# Patient Record
Sex: Male | Born: 1955 | Race: Black or African American | Hispanic: No | Marital: Married | Smoking: Never smoker
Health system: Southern US, Community
[De-identification: ages and names within clinical notes are randomized; demographics above are authoritative.]

## PROBLEM LIST (undated history)

## (undated) DIAGNOSIS — J45909 Unspecified asthma, uncomplicated: Secondary | ICD-10-CM

## (undated) HISTORY — PX: CYST REMOVAL NECK: SHX6281

## (undated) HISTORY — PX: URINARY SURGERY: SHX2626

---

## 2014-11-01 ENCOUNTER — Encounter (HOSPITAL_COMMUNITY): Payer: Self-pay

## 2014-11-01 ENCOUNTER — Emergency Department (HOSPITAL_COMMUNITY)
Admission: EM | Admit: 2014-11-01 | Discharge: 2014-11-01 | Disposition: A | Discharge status: Home / Self Care | Payer: Non-veteran care | Attending: Emergency Medicine | Admitting: Emergency Medicine

## 2014-11-01 DIAGNOSIS — M545 Low back pain, unspecified: Secondary | ICD-10-CM

## 2014-11-01 MED ORDER — ORPHENADRINE CITRATE ER 100 MG PO TB12: 100.0000 mg | ORAL_TABLET | Freq: Two times a day (BID) | ORAL | Status: DC

## 2014-11-01 MED ORDER — OXYCODONE-ACETAMINOPHEN 5-325 MG PO TABS
1.0000 | ORAL_TABLET | ORAL | Status: DC | PRN
Start: 2014-11-01 — End: 2021-03-03

## 2014-11-01 NOTE — ED Notes (Signed)
Pt states back pain started yesterday, got worse overnight, today lower back pain is unbearable. Pt denies trauma and heavy lifting.

## 2014-11-01 NOTE — Discharge Instructions (Signed)
Take your ibuprofen 800 mg tablets three times a day.  Back Pain, Adult Low back pain is very common. About 1 in 5 people have back pain.The cause of low back pain is rarely dangerous. The pain often gets better over time.About half of people with a sudden onset of back pain feel better in just 2 weeks. About 8 in 10 people feel better by 6 weeks.  CAUSES Some common causes of back pain include:  Strain of the muscles or ligaments supporting the spine.  Wear and tear (degeneration) of the spinal discs.  Arthritis.  Direct injury to the back. DIAGNOSIS Most of the time, the direct cause of low back pain is not known.However, back pain can be treated effectively even when the exact cause of the pain is unknown.Answering your caregiver's questions about your overall health and symptoms is one of the most accurate ways to make sure the cause of your pain is not dangerous. If your caregiver needs more information, he or she may order lab work or imaging tests (X-rays or MRIs).However, even if imaging tests show changes in your back, this usually does not require surgery. HOME CARE INSTRUCTIONS For many people, back pain returns.Since low back pain is rarely dangerous, it is often a condition that people can learn to [facility] their own.   Remain active. It is stressful on the back to sit or stand in one place. Do not sit, drive, or stand in one place for more than 30 minutes at a time. Take short walks on level surfaces as soon as pain allows.Try to increase the length of time you walk each day.  Do not stay in bed.Resting more than 1 or 2 days can delay your recovery.  Do not avoid exercise or work.Your body is made to move.It is not dangerous to be active, even though your back may hurt.Your back will likely heal faster if you return to being active before your pain is gone.  Pay attention to your body when you bend and lift. Many people have less discomfortwhen lifting if they  bend their knees, keep the load close to their bodies,and avoid twisting. Often, the most comfortable positions are those that put less stress on your recovering back.  Find a comfortable position to sleep. Use a firm mattress and lie on your side with your knees slightly bent. If you lie on your back, put a pillow under your knees.  Only take over-the-counter or prescription medicines as directed by your caregiver. Over-the-counter medicines to reduce pain and inflammation are often the most helpful.Your caregiver may prescribe muscle relaxant drugs.These medicines help dull your pain so you can more quickly return to your normal activities and healthy exercise.  Put ice on the injured area.  Put ice in a plastic bag.  Place a towel between your skin and the bag.  Leave the ice on for 15-20 minutes, 03-04 times a day for the first 2 to 3 days. After that, ice and heat may be alternated to reduce pain and spasms.  Ask your caregiver about trying back exercises and gentle massage. This may be of some benefit.  Avoid feeling anxious or stressed.Stress increases muscle tension and can worsen back pain.It is important to recognize when you are anxious or stressed and learn ways to manage it.Exercise is a great option. SEEK MEDICAL CARE IF:  You have pain that is not relieved with rest or medicine.  You have pain that does not improve in 1 week.  You  have new symptoms.  You are generally not feeling well. SEEK IMMEDIATE MEDICAL CARE IF:   You have pain that radiates from your back into your legs.  You develop new bowel or bladder control problems.  You have unusual weakness or numbness in your arms or legs.  You develop nausea or vomiting.  You develop abdominal pain.  You feel faint. Document Released: 02/09/2005 Document Revised: 08/11/2011 Document Reviewed: 06/13/2013 [facility] Patient Information 2015 [place], [place]. This information is not intended to replace advice  given to you by your health care provider. Make sure you discuss any questions you have with your health care provider.  Orphenadrine tablets What is this medicine? ORPHENADRINE (or FEN a dreen) helps to relieve pain and stiffness in muscles and can treat muscle spasms. This medicine may be used for other purposes; ask your health care provider or pharmacist if you have questions. COMMON BRAND NAME(S): Norflex What should I tell my health care provider before I take this medicine? They need to know if you have any of these conditions: -glaucoma -heart disease -kidney disease -myasthenia gravis -peptic ulcer disease -prostate disease -stomach problems -an unusual or allergic reaction to orphenadrine, other medicines, foods, lactose, dyes, or preservatives -pregnant or trying to get pregnant -breast-feeding How should I use this medicine? Take this medicine by mouth with a full glass of water. Follow the directions on the prescription label. Take your medicine at regular intervals. Do not take your medicine more often than directed. Do not take more than you are told to take. Talk to your pediatrician regarding the use of this medicine in children. Special care may be needed. Patients over 58 years old may have a stronger reaction and need a smaller dose. Overdosage: If you think you have taken too much of this medicine contact a poison control center or emergency room at once. NOTE: This medicine is only for you. Do not share this medicine with others. What if I miss a dose? If you miss a dose, take it as soon as you can. If it is almost time for your next dose, take only that dose. Do not take double or extra doses. What may interact with this medicine? -alcohol -antihistamines -barbiturates, like phenobarbital -benzodiazepines -cyclobenzaprine -medicines for pain -phenothiazines like chlorpromazine, mesoridazine, prochlorperazine, thioridazine This list may not describe all  possible interactions. Give your health care provider a list of all the medicines, herbs, non-prescription drugs, or dietary supplements you use. Also tell them if you smoke, drink alcohol, or use illegal drugs. Some items may interact with your medicine. What should I watch for while using this medicine? Your mouth may get dry. Chewing sugarless gum or sucking hard candy, and drinking plenty of water may help. Contact your doctor if the problem does not go away or is severe. This medicine may cause dry eyes and blurred vision. If you wear contact lenses you may feel some discomfort. Lubricating drops may help. See your eye doctor if the problem does not go away or is severe. You may get drowsy or dizzy. Do not drive, use machinery, or do anything that needs mental alertness until you know how this medicine affects you. Do not stand or sit up quickly, especially if you are an older patient. This reduces the risk of dizzy or fainting spells. Alcohol may interfere with the effect of this medicine. Avoid alcoholic drinks. What side effects may I notice from receiving this medicine? Side effects that you should report to your doctor or health  care professional as soon as possible: -allergic reactions like skin rash, itching or hives, swelling of the face, lips, or tongue -changes in vision -difficulty breathing -fast heartbeat or palpitations -hallucinations -light headedness, fainting spells -vomiting Side effects that usually do not require medical attention (report to your doctor or health care professional if they continue or are bothersome): -dizziness -drowsiness -headache -nausea This list may not describe all possible side effects. Call your doctor for medical advice about side effects. You may report side effects to FDA at 1-800-FDA-1088. Where should I keep my medicine? Keep out of the reach of children. Store at room temperature between 15 and 30 degrees C (59 and 86 degrees F). Protect  from light. Keep container tightly closed. Throw away any unused medicine after the expiration date. NOTE: This sheet is a summary. It may not cover all possible information. If you have questions about this medicine, talk to your doctor, pharmacist, or health care provider.  2015, Elsevier/Gold Standard. (2007-09-06 17:19:12)  Acetaminophen; Oxycodone tablets What is this medicine? ACETAMINOPHEN; OXYCODONE (a set a MEE noe fen; ox i KOE done) is a pain reliever. It is used to treat mild to moderate pain. This medicine may be used for other purposes; ask your health care provider or pharmacist if you have questions. COMMON BRAND NAME(S): Endocet, Magnacet, Narvox, Percocet, Perloxx, Primalev, Primlev, Roxicet, Xolox What should I tell my health care provider before I take this medicine? They need to know if you have any of these conditions: -brain tumor -Crohn's disease, inflammatory bowel disease, or ulcerative colitis -drug abuse or addiction -head injury -heart or circulation problems -if you often drink alcohol -kidney disease or problems going to the bathroom -liver disease -lung disease, asthma, or breathing problems -an unusual or allergic reaction to acetaminophen, oxycodone, other opioid analgesics, other medicines, foods, dyes, or preservatives -pregnant or trying to get pregnant -breast-feeding How should I use this medicine? Take this medicine by mouth with a full glass of water. Follow the directions on the prescription label. Take your medicine at regular intervals. Do not take your medicine more often than directed. Talk to your pediatrician regarding the use of this medicine in children. Special care may be needed. Patients over 40 years old may have a stronger reaction and need a smaller dose. Overdosage: If you think you have taken too much of this medicine contact a poison control center or emergency room at once. NOTE: This medicine is only for you. Do not share this  medicine with others. What if I miss a dose? If you miss a dose, take it as soon as you can. If it is almost time for your next dose, take only that dose. Do not take double or extra doses. What may interact with this medicine? -alcohol -antihistamines -barbiturates like amobarbital, butalbital, butabarbital, methohexital, pentobarbital, phenobarbital, thiopental, and secobarbital -benztropine -drugs for bladder problems like solifenacin, trospium, oxybutynin, tolterodine, hyoscyamine, and methscopolamine -drugs for breathing problems like ipratropium and tiotropium -drugs for certain stomach or intestine problems like propantheline, homatropine methylbromide, glycopyrrolate, atropine, belladonna, and dicyclomine -general anesthetics like etomidate, ketamine, nitrous oxide, propofol, desflurane, enflurane, halothane, isoflurane, and sevoflurane -medicines for depression, anxiety, or psychotic disturbances -medicines for sleep -muscle relaxants -naltrexone -narcotic medicines (opiates) for pain -phenothiazines like perphenazine, thioridazine, chlorpromazine, mesoridazine, fluphenazine, prochlorperazine, promazine, and trifluoperazine -scopolamine -tramadol -trihexyphenidyl This list may not describe all possible interactions. Give your health care provider a list of all the medicines, herbs, non-prescription drugs, or dietary supplements you use. Also tell  them if you smoke, drink alcohol, or use illegal drugs. Some items may interact with your medicine. What should I watch for while using this medicine? Tell your doctor or health care professional if your pain does not go away, if it gets worse, or if you have new or a different type of pain. You may develop tolerance to the medicine. Tolerance means that you will need a higher dose of the medication for pain relief. Tolerance is normal and is expected if you take this medicine for a long time. Do not suddenly stop taking your medicine  because you may develop a severe reaction. Your body becomes used to the medicine. This does NOT mean you are addicted. Addiction is a behavior related to getting and using a drug for a non-medical reason. If you have pain, you have a medical reason to take pain medicine. Your doctor will tell you how much medicine to take. If your doctor wants you to stop the medicine, the dose will be slowly lowered over time to avoid any side effects. You may get drowsy or dizzy. Do not drive, use machinery, or do anything that needs mental alertness until you know how this medicine affects you. Do not stand or sit up quickly, especially if you are an older patient. This reduces the risk of dizzy or fainting spells. Alcohol may interfere with the effect of this medicine. Avoid alcoholic drinks. There are different types of narcotic medicines (opiates) for pain. If you take more than one type at the same time, you may have more side effects. Give your health care provider a list of all medicines you use. Your doctor will tell you how much medicine to take. Do not take more medicine than directed. Call emergency for help if you have problems breathing. The medicine will cause constipation. Try to have a bowel movement at least every 2 to 3 days. If you do not have a bowel movement for 3 days, call your doctor or health care professional. Do not take Tylenol (acetaminophen) or medicines that have acetaminophen with this medicine. Too much acetaminophen can be very dangerous. Many nonprescription medicines contain acetaminophen. Always read the labels carefully to avoid taking more acetaminophen. What side effects may I notice from receiving this medicine? Side effects that you should report to your doctor or health care professional as soon as possible: -allergic reactions like skin rash, itching or hives, swelling of the face, lips, or tongue -breathing difficulties, wheezing -confusion -light headedness or fainting  spells -severe stomach pain -unusually weak or tired -yellowing of the skin or the whites of the eyes Side effects that usually do not require medical attention (report to your doctor or health care professional if they continue or are bothersome): -dizziness -drowsiness -nausea -vomiting This list may not describe all possible side effects. Call your doctor for medical advice about side effects. You may report side effects to FDA at 1-800-FDA-1088. Where should I keep my medicine? Keep out of the reach of children. This medicine can be abused. Keep your medicine in a safe place to protect it from theft. Do not share this medicine with anyone. Selling or giving away this medicine is dangerous and against the law. Store at room temperature between 20 and 25 degrees C (68 and 77 degrees F). Keep container tightly closed. Protect from light. This medicine may cause accidental overdose and death if it is taken by other adults, children, or pets. Flush any unused medicine down the toilet to reduce the  chance of harm. Do not use the medicine after the expiration date. NOTE: This sheet is a summary. It may not cover all possible information. If you have questions about this medicine, talk to your doctor, pharmacist, or health care provider.  2015, Elsevier/Gold Standard. (2012-10-03 13:17:35)

## 2014-11-01 NOTE — ED Provider Notes (Signed)
CSN: [id number]     Arrival date & time 11/01/14  4098 History   First MD Initiated Contact with Patient 11/01/14 [phone]     Chief Complaint  Patient presents with  . Back Pain     (Consider location/radiation/quality/duration/timing/severity/associated sxs/prior Treatment) Patient is a 59 y.o. male presenting with back pain. The history is provided by the patient.  Back Pain He started having pain in his lower back yesterday and it got worse today. Pain is sharp and he rates it at 10/10. It is worse with movement and better when he stays in one position. There is no radiation of pain. There is no numbness or weakness and he denies bowel or bladder dysfunction. Denies any recent trauma or unusual lifting or bending. He has had back problems in the past. He is a nonsmoker and does not have a history of hypertension.  History reviewed. No pertinent past medical history. Past Surgical History  Procedure Laterality Date  . Cyst removal neck    . Urinary surgery     No family history on file. Social History  Substance Use Topics  . Smoking status: Never Smoker   . Smokeless tobacco: None  . Alcohol Use: No    Review of Systems  Musculoskeletal: Positive for back pain.  All other systems reviewed and are negative.     Allergies  Review of patient's allergies indicates no known allergies.  Home Medications   Prior to Admission medications   Not on File   BP 129/70 mmHg  Pulse 73  Temp(Src) 98.3 F (36.8 C) (Oral)  Resp 14  Ht  (1.651 m)  Wt 185 lb (83.915 kg)  BMI 30.79 kg/m2  SpO2 97% Physical Exam  Nursing note and vitals reviewed.  59 year old male, resting comfortably and in no acute distress. Vital signs are normal. Oxygen saturation is 97%, which is normal. Head is normocephalic and atraumatic. PERRLA, EOMI. Oropharynx is clear. Neck is nontender and supple without adenopathy or JVD. Back is moderately tender in the left paralumbar area with mild to moderate  spasm. There is no midline tenderness. Straight leg raise is negative on the right and positive on the left at 45. Lungs are clear without rales, wheezes, or rhonchi. Chest is nontender. Heart has regular rate and rhythm without murmur. Abdomen is soft, flat, nontender without masses or hepatosplenomegaly and peristalsis is normoactive. Aorta is not palpable and no bruits are heard. Extremities have no cyanosis or edema, full range of motion is present. Skin is warm and dry without rash. Neurologic: Mental status is normal, cranial nerves are intact, there are no motor or sensory deficits.   ED Course  Procedures (including critical care time)  MDM   Final diagnoses:  Midline low back pain without sciatica    Musculoskeletal low back pain. Prescriptions are given for orphenadrine and oxycodone-acetaminophen. Patient was offered prescription for naproxen but states that he has ibuprofen 800 mg tablets at home and that they work well for him. He is advised to take those 3 times a day.    Dione Booze, MD 11/01/14 [phone]

## 2019-05-18 ENCOUNTER — Ambulatory Visit: Payer: Non-veteran care | Attending: Family

## 2019-05-18 DIAGNOSIS — Z23 Encounter for immunization: Secondary | ICD-10-CM

## 2019-05-18 NOTE — Progress Notes (Signed)
   Covid-19 Vaccination Clinic  Name:  Andre Fitzpatrick    MRN: [id number] DOB: [birth year]  05/18/2019  Mr. Read was observed post Covid-19 immunization for 15 minutes without incident. He was provided with Vaccine Information Sheet and instruction to access the V-Safe system.   [name] was instructed to call 911 with any severe reactions post vaccine: Marland Kitchen Difficulty breathing  . Swelling of face and throat  . A fast heartbeat  . A bad rash all over body  . Dizziness and weakness   Immunizations Administered    Name Date Dose VIS Date Route   Moderna COVID-19 Vaccine 05/18/2019 12:52 PM 0.5 mL 01/24/2019 Intramuscular   Manufacturer: Moderna   Lot: [id number]   NDC: 33825-053-97

## 2019-06-20 ENCOUNTER — Ambulatory Visit: Payer: Non-veteran care

## 2019-06-20 ENCOUNTER — Ambulatory Visit: Payer: Non-veteran care | Attending: Family

## 2019-06-20 DIAGNOSIS — Z23 Encounter for immunization: Secondary | ICD-10-CM

## 2019-06-20 NOTE — Progress Notes (Signed)
   Covid-19 Vaccination Clinic  Name:  Andre Fitzpatrick    MRN: [id number] DOB: 1955-09-09  06/20/2019  [name] was observed post Covid-19 immunization for 15 minutes without incident. He was provided with Vaccine Information Sheet and instruction to access the V-Safe system.   [name] was instructed to call 911 with any severe reactions post vaccine: Marland Kitchen Difficulty breathing  . Swelling of face and throat  . A fast heartbeat  . A bad rash all over body  . Dizziness and weakness   Immunizations Administered    Name Date Dose VIS Date Route   Moderna COVID-19 Vaccine 06/20/2019  3:04 PM 0.5 mL 01/2019 Intramuscular   Manufacturer: Moderna   Lot: [id number]   NDC: 06582-608-88

## 2020-02-08 ENCOUNTER — Ambulatory Visit: Payer: Non-veteran care | Attending: Family

## 2020-02-08 DIAGNOSIS — Z23 Encounter for immunization: Secondary | ICD-10-CM

## 2020-02-12 ENCOUNTER — Ambulatory Visit: Payer: Non-veteran care

## 2020-06-12 NOTE — Progress Notes (Signed)
   Covid-19 Vaccination Clinic  Name:  ALICE VITELLI    MRN: [id number] DOB: Jul 04, 1955  06/12/2020  Mr. Ocallaghan was observed post Covid-19 immunization for 15 minutes without incident. He was provided with Vaccine Information Sheet and instruction to access the V-Safe system.   Mr. Ayala was instructed to call 911 with any severe reactions post vaccine: Marland Kitchen Difficulty breathing  . Swelling of face and throat  . A fast heartbeat  . A bad rash all over body  . Dizziness and weakness   Immunizations Administered    Name Date Dose VIS Date Route   Moderna Covid-19 Booster Vaccine 02/08/2020 11:00 AM 0.25 mL 12/13/2019 Intramuscular   Manufacturer: Moderna   Lot: [id number]   NDC: 91791-505-69

## 2020-09-12 ENCOUNTER — Ambulatory Visit: Payer: Non-veteran care | Attending: Family

## 2020-09-12 DIAGNOSIS — Z23 Encounter for immunization: Secondary | ICD-10-CM

## 2020-09-12 NOTE — Progress Notes (Signed)
   Covid-19 Vaccination Clinic  Name:  NICCOLAS LOEPER    MRN: [id number] DOB: [birth year]  09/12/2020  Mr. Kleckner was observed post Covid-19 immunization for 15 minutes without incident. He was provided with Vaccine Information Sheet and instruction to access the V-Safe system.   Mr. Tindel was instructed to call 911 with any severe reactions post vaccine: Difficulty breathing  Swelling of face and throat  A fast heartbeat  A bad rash all over body  Dizziness and weakness   Immunizations Administered     Name Date Dose VIS Date Route   Moderna Covid-19 Booster Vaccine 09/12/2020  1:45 PM 0.25 mL 12/13/2019 Intramuscular   Manufacturer: Moderna   Lot: [id number]   NDC: 92446-286-38

## 2021-03-03 ENCOUNTER — Ambulatory Visit (HOSPITAL_COMMUNITY)
Admission: EM | Admit: 2021-03-03 | Discharge: 2021-03-03 | Disposition: A | Discharge status: Home / Self Care | Payer: No Typology Code available for payment source | Attending: Emergency Medicine | Admitting: Emergency Medicine

## 2021-03-03 ENCOUNTER — Encounter (HOSPITAL_COMMUNITY): Payer: Self-pay

## 2021-03-03 ENCOUNTER — Ambulatory Visit (INDEPENDENT_AMBULATORY_CARE_PROVIDER_SITE_OTHER): Payer: No Typology Code available for payment source

## 2021-03-03 ENCOUNTER — Other Ambulatory Visit: Payer: Self-pay

## 2021-03-03 DIAGNOSIS — Z20822 Contact with and (suspected) exposure to covid-19: Secondary | ICD-10-CM | POA: Insufficient documentation

## 2021-03-03 DIAGNOSIS — J189 Pneumonia, unspecified organism: Secondary | ICD-10-CM

## 2021-03-03 DIAGNOSIS — R030 Elevated blood-pressure reading, without diagnosis of hypertension: Secondary | ICD-10-CM | POA: Insufficient documentation

## 2021-03-03 LAB — POC INFLUENZA A AND B ANTIGEN (URGENT CARE ONLY)
INFLUENZA A ANTIGEN, POC: NEGATIVE
INFLUENZA B ANTIGEN, POC: NEGATIVE

## 2021-03-03 MED ORDER — AMOXICILLIN-POT CLAVULANATE 875-125 MG PO TABS: 1.0000 | ORAL_TABLET | Freq: Two times a day (BID) | ORAL | 0 refills | Status: AC

## 2021-03-03 MED ORDER — PROMETHAZINE-DM 6.25-15 MG/5ML PO SYRP: 5.0000 mL | ORAL_SOLUTION | Freq: Four times a day (QID) | ORAL | 0 refills | Status: DC | PRN

## 2021-03-03 MED ORDER — AEROCHAMBER PLUS FLO-VU LARGE MISC: 1.0000 | Freq: Once | 0 refills | Status: AC

## 2021-03-03 MED ORDER — GUAIFENESIN 400 MG PO TABS: ORAL_TABLET | ORAL | 0 refills | Status: DC

## 2021-03-03 MED ORDER — ALBUTEROL SULFATE HFA 108 (90 BASE) MCG/ACT IN AERS: 2.0000 | INHALATION_SPRAY | Freq: Four times a day (QID) | RESPIRATORY_TRACT | 0 refills | Status: DC | PRN

## 2021-03-03 MED ORDER — ALBUTEROL SULFATE (2.5 MG/3ML) 0.083% IN NEBU
INHALATION_SOLUTION | RESPIRATORY_TRACT | Status: AC
  Filled 2021-03-03: qty 3

## 2021-03-03 MED ORDER — ALBUTEROL SULFATE (2.5 MG/3ML) 0.083% IN NEBU
2.5000 mg | INHALATION_SOLUTION | Freq: Once | RESPIRATORY_TRACT | Status: AC
  Administered 2021-03-03: 2.5 mg via RESPIRATORY_TRACT

## 2021-03-03 NOTE — Discharge Instructions (Addendum)
Your chest x-ray today was concerning for right-sided pneumonia.  For this reason, I recommend that you begin taking Augmentin for the next 7 days.  Please take 1 tablet in the morning and 1 tablet in the evening.  Please do not skip doses and please take all as prescribed.  I also recommend that you continue using Mucinex and Tylenol as you have been taking.  In addition to this, please inhale albuterol, 2 puffs up to 4 times daily as needed.  When you pick up this prescription, there will be a spacer along with it.  Please asked the pharmacist to show you how to use this.  I recommend that for the next 2 to 3 days, please schedule these doses morning, midday, afternoon and bedtime.  For nighttime cough, please take Promethazine DM 5 mL 30 to 45 minutes prior to bedtime.  It is very important that you follow-up with your primary care provider in the next 3 to 5 days for repeat evaluation to ensure that you continue to improve.  Certainly, if your symptoms get worse despite these medication recommendations, you should go to the emergency room for further evaluation.  Thank you for visiting urgent care today.  I hope you feel better soon.

## 2021-03-03 NOTE — ED Triage Notes (Signed)
Pt c/o productive cough with yellow/white sputum and SOB x2 days. C/o wheezing today.

## 2021-03-03 NOTE — ED Provider Notes (Signed)
[facility]    CSN: [id number] Arrival date & time: 03/03/21  1222    HISTORY   Chief Complaint  Patient presents with   Shortness of Breath   Cough   HPI Andre Fitzpatrick is a 66 y.o. male. Patient complains of a 2-day history of cough productive of yellow/white sputum and shortness of breath.  Patient states he feels like he is wheezing today.  On arrival, patient has a significantly elevated blood pressure, temp of 99.8 and a pulse of 114.  Patient is here with his wife today who states that he became sick very quickly.  She states she has been giving him Tylenol and Mucinex with some relief of symptoms.  On physical exam, patient is much warmer than oral temperature of 98.8.  Patient denies history of asthma, allergies, COPD, previous prescriptions for inhaled respiratory medications.  Today is negative.  The history is provided by the patient.  History reviewed. No pertinent past medical history. There are no problems to display for this patient.  Past Surgical History:  Procedure Laterality Date   CYST REMOVAL NECK     URINARY SURGERY      Home Medications    Prior to Admission medications   Not on File   Family History History reviewed. No pertinent family history. Social History Social History   Tobacco Use   Smoking status: Never   Smokeless tobacco: Never  Substance Use Topics   Alcohol use: No   Allergies   Patient has no known allergies.  Review of Systems Review of Systems Pertinent findings noted in history of present illness.   Physical Exam Triage Vital Signs ED Triage Vitals  Enc Vitals Group     BP 12/20/20 0827 (!) 147/82     Pulse Rate 12/20/20 0827 72     Resp 12/20/20 0827 18     Temp 12/20/20 0827 98.3 F (36.8 C)     Temp Source 12/20/20 0827 Oral     SpO2 12/20/20 0827 98 %     Weight --      Height --      Head Circumference --      Peak Flow --      Pain Score 12/20/20 0826 5     Pain Loc --      Pain Edu? --       Excl. in GC? --   No data found.  Updated Vital Signs BP (!) 190/94 (BP Location: Left Arm)    Pulse (!) 114    Temp 99.8 F (37.7 C) (Oral)    Resp 20    SpO2 95%   Physical Exam Constitutional:      Appearance: He is ill-appearing.  HENT:     Head: Normocephalic and atraumatic.     Salivary Glands: Right salivary gland is not diffusely enlarged or tender. Left salivary gland is not diffusely enlarged or tender.     Right Ear: Tympanic membrane, ear canal and external ear normal.     Left Ear: Tympanic membrane, ear canal and external ear normal.     Nose: Congestion and rhinorrhea present. Rhinorrhea is clear.     Right Sinus: No maxillary sinus tenderness or frontal sinus tenderness.     Left Sinus: No maxillary sinus tenderness.     Mouth/Throat:     Mouth: Mucous membranes are moist.     Pharynx: Pharyngeal swelling, posterior oropharyngeal erythema and uvula swelling present.     Tonsils: No tonsillar exudate.  0 on the right. 0 on the left.  Cardiovascular:     Rate and Rhythm: Normal rate and regular rhythm.     Pulses: Normal pulses.  Pulmonary:     Effort: Pulmonary effort is normal. No accessory muscle usage, prolonged expiration or respiratory distress.     Breath sounds: No stridor. Examination of the right-upper field reveals wheezing. Examination of the left-upper field reveals wheezing. Examination of the right-middle field reveals wheezing and rales. Examination of the left-middle field reveals wheezing and rales. Examination of the right-lower field reveals wheezing. Examination of the left-lower field reveals wheezing. Wheezing and rales present. No rhonchi.     Comments: Turbulent breath sounds throughout   Abdominal:     General: Abdomen is flat. Bowel sounds are normal.     Palpations: Abdomen is soft.  Musculoskeletal:        General: Normal range of motion.  Lymphadenopathy:     Cervical: Cervical adenopathy present.     Right cervical: Superficial  cervical adenopathy, deep cervical adenopathy and posterior cervical adenopathy present.     Left cervical: Superficial cervical adenopathy, deep cervical adenopathy and posterior cervical adenopathy present.  Skin:    General: Skin is warm and dry.  Neurological:     General: No focal deficit present.     Mental Status: He is alert and oriented to person, place, and time.     Motor: Motor function is intact.     Coordination: Coordination is intact.     Gait: Gait is intact.     Deep Tendon Reflexes: Reflexes are normal and symmetric.  Psychiatric:        Attention and Perception: Attention and perception normal.        Mood and Affect: Mood and affect normal.        Speech: Speech normal.        Behavior: Behavior normal. Behavior is cooperative.        Thought Content: Thought content normal.    Visual Acuity Right Eye Distance:   Left Eye Distance:   Bilateral Distance:    Right Eye Near:   Left Eye Near:    Bilateral Near:     UC Couse / Diagnostics / Procedures:    EKG  Radiology DG Chest 2 View  Result Date: 03/03/2021 CLINICAL DATA:  Decreased breath sounds cough EXAM: CHEST - 2 VIEW COMPARISON:  None. FINDINGS: The heart size and mediastinal contours are within normal limits. Hazy right perihilar airspace opacity. Left lung appears clear. No pleural effusion or pneumothorax. The visualized skeletal structures are unremarkable. IMPRESSION: Hazy right perihilar airspace opacity suspicious for pneumonia. Electronically Signed   By: Duanne GuessNicholas  Plundo D.O.   On: 03/03/2021 14:20    Procedures Procedures (including critical care time)  UC Diagnoses / Final Clinical Impressions(s)   I have reviewed the triage vital signs and the nursing notes.  Pertinent labs & imaging results that were available during my care of the patient were reviewed by me and considered in my medical decision making (see chart for details).   Final diagnoses:  Community acquired pneumonia of right  lung, unspecified part of lung   Right-sided pneumonia.  Patient had significant improvement of breath sounds with albuterol treatment.  Began Augmentin, continue albuterol.  Continue Mucinex and Tylenol.  Promethazine DM added for nighttime cough.  Patient advised to follow-up in 3 to 5 days with his primary care provider.  ED Prescriptions     Medication Sig Dispense Auth. Provider  amoxicillin-clavulanate (AUGMENTIN) 875-125 MG tablet Take 1 tablet by mouth 2 (two) times daily for 7 days. 14 tablet Theadora RamaMorgan, Fraizer Scales, PA-C   albuterol (VENTOLIN HFA) 108 (90 Base) MCG/ACT inhaler Inhale 2 puffs into the lungs every 6 (six) hours as needed for wheezing or shortness of breath (Cough). 18 g Theadora RamaMorgan, Canal Scales, PA-C   guaifenesin (HUMIBID E) 400 MG TABS tablet Take 1 tablet 3 times daily as needed for chest congestion and cough 21 tablet Theadora RamaMorgan, Kerrick Scales, PA-C   promethazine-dextromethorphan (PROMETHAZINE-DM) 6.25-15 MG/5ML syrup Take 5 mLs by mouth 4 (four) times daily as needed for cough. 180 mL Theadora RamaMorgan, Fleece Scales, PA-C   Spacer/Aero-Holding Chambers (AEROCHAMBER PLUS FLO-VU LARGE) MISC 1 each by Other route once for 1 dose. 1 each Theadora RamaMorgan, Kimmons Scales, PA-C      PDMP not reviewed this encounter.  Pending results:  Labs Reviewed  SARS CORONAVIRUS 2 (TAT 6-24 HRS)  POC INFLUENZA A AND B ANTIGEN (URGENT CARE ONLY)    Medications Ordered in UC: Medications  albuterol (PROVENTIL) (2.5 MG/3ML) 0.083% nebulizer solution 2.5 mg (2.5 mg Nebulization Given 03/03/21 1358)    Disposition Upon Discharge:  Condition: stable for discharge home Home: take medications as prescribed; routine discharge instructions as discussed; follow up as advised.  Patient presented with an acute illness with associated systemic symptoms and significant discomfort requiring urgent management. In my opinion, this is a condition that a prudent lay person (someone who possesses an average knowledge  of health and medicine) may potentially expect to result in complications if not addressed urgently such as respiratory distress, impairment of bodily function or dysfunction of bodily organs.   Routine symptom specific, illness specific and/or disease specific instructions were discussed with the patient and/or caregiver at length.   As such, the patient has been evaluated and assessed, work-up was performed and treatment was provided in alignment with urgent care protocols and evidence based medicine.  Patient/parent/caregiver has been advised that the patient may require follow up for further testing and treatment if the symptoms continue in spite of treatment, as clinically indicated and appropriate.  If the patient was tested for COVID-19, Influenza and/or RSV, then the patient/parent/guardian was advised to isolate at home pending the results of his/her diagnostic coronavirus test and potentially longer if theyre positive. I have also advised pt that if his/her COVID-19 test returns positive, it's recommended to self-isolate for at least 10 days after symptoms first appeared AND until fever-free for 24 hours without fever reducer AND other symptoms have improved or resolved. Discussed self-isolation recommendations as well as instructions for household member/close contacts as per the [facility] and [place] DHHS, and also gave patient the COVID packet with this information.  Patient/parent/caregiver has been [facility]-5 days if no better; to PCP or the Emergency Department if new signs and symptoms develop, or if the current signs or symptoms continue to change or worsen for further workup, evaluation and treatment as clinically indicated and appropriate  The patient will follow up with their current PCP if and as advised. If the patient does not currently have a PCP we will assist them in obtaining one.   The patient may need specialty follow up if the symptoms continue, in  spite of conservative treatment and management, for further workup, evaluation, consultation and treatment as clinically indicated and appropriate.  Patient/parent/caregiver verbalized understanding and agreement of plan as discussed.  All questions were addressed during visit.  Please see  discharge instructions below for further details of plan.  Discharge Instructions:   Discharge Instructions      Your chest x-ray today was concerning for right-sided pneumonia.  For this reason, I recommend that you begin taking Augmentin for the next 7 days.  Please take 1 tablet in the morning and 1 tablet in the evening.  Please do not skip doses and please take all as prescribed.  I also recommend that you continue using Mucinex and Tylenol as you have been taking.  In addition to this, please inhale albuterol, 2 puffs up to 4 times daily as needed.  When you pick up this prescription, there will be a spacer along with it.  Please asked the pharmacist to show you how to use this.  I recommend that for the next 2 to 3 days, please schedule these doses morning, midday, afternoon and bedtime.  For nighttime cough, please take Promethazine DM 5 mL 30 to 45 minutes prior to bedtime.  It is very important that you follow-up with your primary care provider in the next 3 to 5 days for repeat evaluation to ensure that you continue to improve.  Certainly, if your symptoms get worse despite these medication recommendations, you should go to the emergency room for further evaluation.  Thank you for visiting urgent care today.  I hope you feel better soon.      This office note has been dictated using Teaching laboratory technician.  Unfortunately, and despite my best efforts, this method of dictation can sometimes lead to occasional typographical or grammatical errors.  I apologize in advance if this occurs.     Dierre, Crevier Scales, PA-C 03/03/21 1431

## 2021-03-04 LAB — SARS CORONAVIRUS 2 (TAT 6-24 HRS): SARS Coronavirus 2: NEGATIVE

## 2021-03-13 ENCOUNTER — Other Ambulatory Visit: Payer: Self-pay

## 2021-03-13 ENCOUNTER — Ambulatory Visit (HOSPITAL_COMMUNITY)
Admission: EM | Admit: 2021-03-13 | Discharge: 2021-03-13 | Disposition: A | Discharge status: Home / Self Care | Payer: No Typology Code available for payment source | Attending: Family Medicine | Admitting: Family Medicine

## 2021-03-13 ENCOUNTER — Encounter (HOSPITAL_COMMUNITY): Payer: Self-pay

## 2021-03-13 DIAGNOSIS — J189 Pneumonia, unspecified organism: Secondary | ICD-10-CM | POA: Diagnosis not present

## 2021-03-13 NOTE — ED Provider Notes (Signed)
°  [facility]   [id number] 03/13/21 Arrival Time: 1106  ASSESSMENT & PLAN:  1. Community acquired pneumonia of right lung, unspecified part of lung    Feeling much better. No SOB. No change in management. Activities as tolerated.   Follow-up Information     Center, [facility].   Specialty: General Practice Why: As needed. Contact information: [address] [place] [address] [phone]                 Reviewed expectations re: course of current medical issues. Questions answered. Outlined signs and symptoms indicating need for more acute intervention. Understanding verbalized. After Visit Summary given.   SUBJECTIVE: History from: patient. Andre Fitzpatrick is a 66 y.o. male who was dx with CAP last week. Here for f/u. Reports much improvement. Lingering cough. Afebrile. No CP/SOB. Denies: headache. Normal PO intake without n/v/d.  OBJECTIVE:  Vitals:   03/13/21 1150  BP: (!) 144/79  Pulse: 71  Resp: 19  Temp: 98.7 F (37.1 C)  TempSrc: Oral  SpO2: 95%    General appearance: alert; no distress Eyes: PERRLA; EOMI; conjunctiva normal HENT: [place]; AT; without nasal congestion Neck: supple  Lungs: speaks full sentences without difficulty; unlabored; initial slight wheezing on R but clears with deep inspiration Extremities: no edema Skin: warm and dry Neurologic: normal gait Psychological: alert and cooperative; normal mood and affect   No Known Allergies  History reviewed. No pertinent past medical history. Social History   Socioeconomic History   Marital status: Married    Spouse name: Not on file   Number of children: Not on file   Years of education: Not on file   Highest education level: Not on file  Occupational History   Not on file  Tobacco Use   Smoking status: Never   Smokeless tobacco: Never  Substance and Sexual Activity   Alcohol use: No   Drug use: Not on file   Sexual activity: Not on file  Other Topics  Concern   Not on file  Social History Narrative   Not on file   Social Determinants of Health   Financial Resource Strain: Not on file  Food Insecurity: Not on file  Transportation Needs: Not on file  Physical Activity: Not on file  Stress: Not on file  Social Connections: Not on file  Intimate Partner Violence: Not on file   History reviewed. No pertinent family history. Past Surgical History:  Procedure Laterality Date   CYST REMOVAL NECK     URINARY SURGERY       Mardella Layman, MD 03/13/21 [phone]

## 2021-03-13 NOTE — ED Triage Notes (Signed)
Pt presents for a follow up.   States he was a seen her 10 days ago and wants his lungs to be re-checked

## 2022-08-06 IMAGING — DX DG CHEST 2V
2 series · 2 of 2 positions shown · non-contrast
Comparison: None.

CLINICAL DATA: Decreased breath sounds cough

EXAM:
CHEST - 2 VIEW

[chest pa]
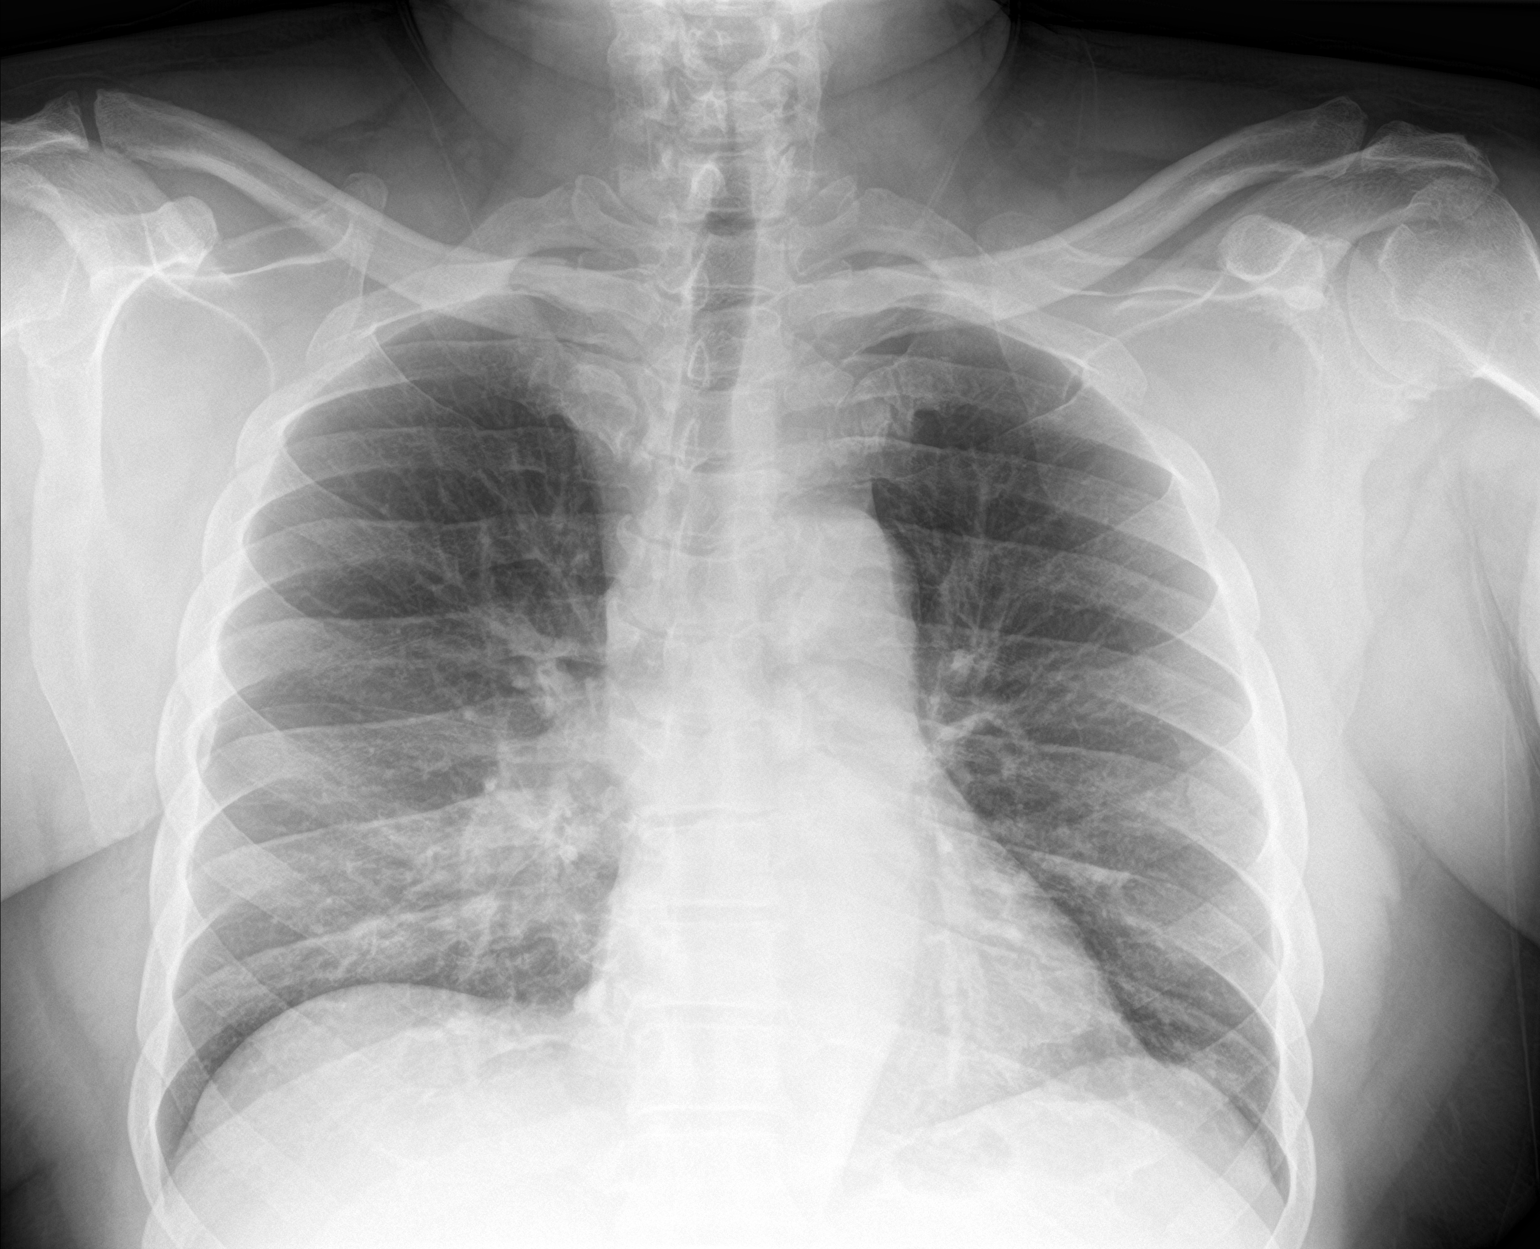

[chest lat]
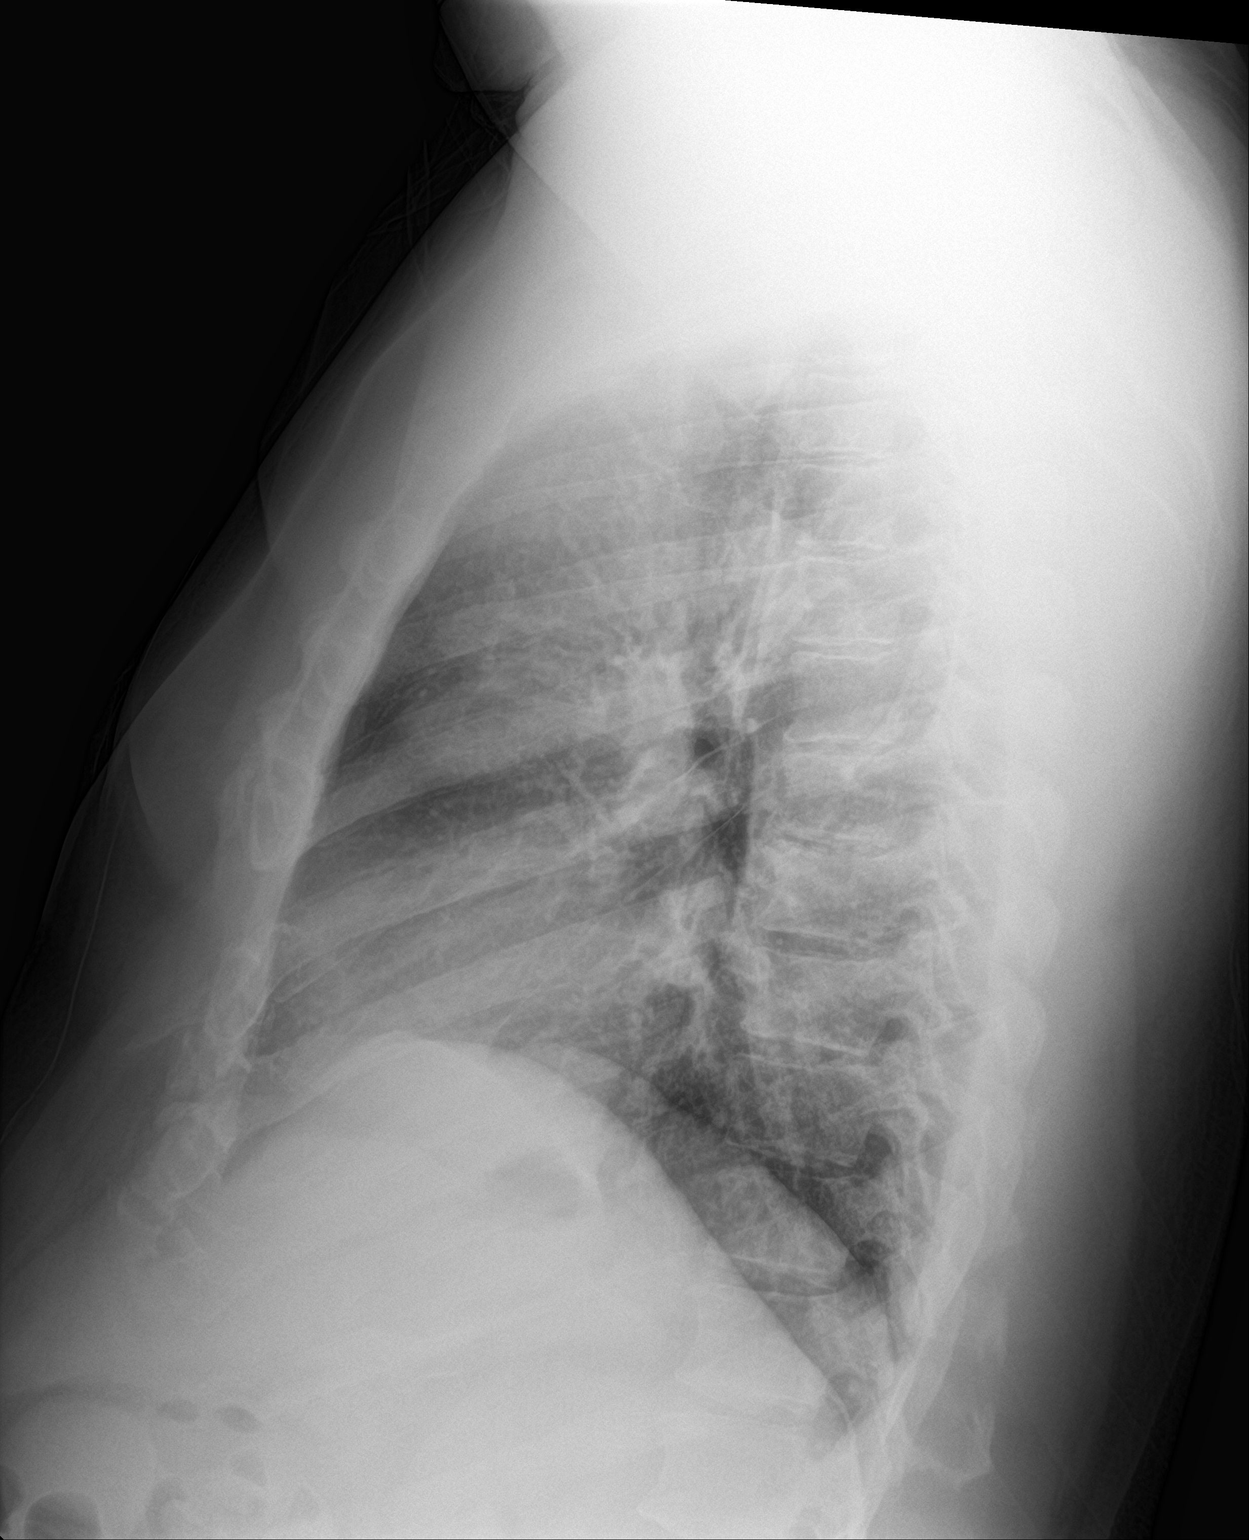

[2 of 2 positions shown; findings below may reference images not displayed]

FINDINGS: The heart size and mediastinal contours are within normal limits.
Hazy right perihilar airspace opacity. Left lung appears clear. No
pleural effusion or pneumothorax. The visualized skeletal structures
are unremarkable.
IMPRESSION: Hazy right perihilar airspace opacity suspicious for pneumonia.

## 2023-10-18 ENCOUNTER — Ambulatory Visit (INDEPENDENT_AMBULATORY_CARE_PROVIDER_SITE_OTHER)

## 2023-10-18 ENCOUNTER — Ambulatory Visit: Payer: Self-pay | Admitting: Urgent Care

## 2023-10-18 ENCOUNTER — Ambulatory Visit
Admission: EM | Admit: 2023-10-18 | Discharge: 2023-10-18 | Disposition: A | Discharge status: Home / Self Care | Attending: Family Medicine | Admitting: Family Medicine

## 2023-10-18 DIAGNOSIS — R062 Wheezing: Secondary | ICD-10-CM

## 2023-10-18 DIAGNOSIS — R0602 Shortness of breath: Secondary | ICD-10-CM

## 2023-10-18 DIAGNOSIS — J4531 Mild persistent asthma with (acute) exacerbation: Secondary | ICD-10-CM

## 2023-10-18 HISTORY — DX: Unspecified asthma, uncomplicated: J45.909

## 2023-10-18 MED ORDER — PREDNISONE 20 MG PO TABS: ORAL_TABLET | ORAL | 0 refills | Status: DC

## 2023-10-18 MED ORDER — ALBUTEROL SULFATE HFA 108 (90 BASE) MCG/ACT IN AERS: 2.0000 | INHALATION_SPRAY | Freq: Four times a day (QID) | RESPIRATORY_TRACT | 0 refills | Status: AC | PRN

## 2023-10-18 NOTE — ED Provider Notes (Signed)
 [facility]  Note:  This document was prepared using Conservation officer, historic buildings and may include unintentional dictation errors.  MRN: [id number] DOB: 09-Jan-1956  Subjective:   Andre Fitzpatrick is a 68 y.o. male presenting for 2-week history of persistent intermittent shortness of breath and wheezing.  No fever, chest pain, cough, runny or stuffy nose.  Has a history of asthma, needs a refill of his inhaler as he is out of it currently.  No smoking of any kind including cigarettes, cigars, vaping, marijuana use.    No current facility-administered medications for this encounter.  Current Outpatient Medications:    albuterol  (VENTOLIN  HFA) 108 (90 Base) MCG/ACT inhaler, Inhale 2 puffs into the lungs every 6 (six) hours as needed for wheezing or shortness of breath (Cough)., Disp: 18 g, Rfl: 0   guaifenesin  (HUMIBID E) 400 MG TABS tablet, Take 1 tablet 3 times daily as needed for chest congestion and cough, Disp: 21 tablet, Rfl: 0   promethazine -dextromethorphan (PROMETHAZINE -DM) 6.25-15 MG/5ML syrup, Take 5 mLs by mouth 4 (four) times daily as needed for cough., Disp: 180 mL, Rfl: 0   No Known Allergies  Past Medical History:  Diagnosis Date   Asthma      Past Surgical History:  Procedure Laterality Date   CYST REMOVAL NECK     URINARY SURGERY      No family history on file.  Social History   Tobacco Use   Smoking status: Never   Smokeless tobacco: Never  Vaping Use   Vaping status: Never Used  Substance Use Topics   Alcohol use: No   Drug use: Never    ROS   Objective:   Vitals: BP (P) 136/80 (BP Location: Right Arm)   Pulse (P) 64   Temp (P) 97.9 F (36.6 C) (Oral)   Resp (P) 20   SpO2 (P) 96%   Physical Exam Constitutional:      General: He is not in acute distress.    Appearance: Normal appearance. He is well-developed. He is not ill-appearing, toxic-appearing or diaphoretic.  HENT:     Head: Normocephalic and  atraumatic.     Right Ear: External ear normal.     Left Ear: External ear normal.     Nose: Nose normal.     Mouth/Throat:     Mouth: Mucous membranes are moist.  Eyes:     General: No scleral icterus.       Right eye: No discharge.        Left eye: No discharge.     Extraocular Movements: Extraocular movements intact.  Cardiovascular:     Rate and Rhythm: Normal rate and regular rhythm.     Heart sounds: Normal heart sounds. No murmur heard.    No friction rub. No gallop.  Pulmonary:     Effort: Pulmonary effort is normal. No respiratory distress.     Breath sounds: No stridor. Examination of the right-upper field reveals wheezing. Examination of the left-upper field reveals wheezing. Examination of the right-middle field reveals wheezing. Examination of the left-middle field reveals wheezing. Wheezing (trace) present. No rhonchi or rales.  Neurological:     Mental Status: He is alert and oriented to person, place, and time.  Psychiatric:        Mood and Affect: Mood normal.        Behavior: Behavior normal.        Thought Content: Thought content normal.     DG Chest 2 View  Result Date: 10/18/2023 CLINICAL DATA:  Shortness of breath and wheezing. EXAM: CHEST - 2 VIEW COMPARISON:  03/03/2021 FINDINGS: Heart size and pulmonary vascularity are normal. Lungs are clear. No pleural effusion or pneumothorax. Mediastinal contours appear intact. IMPRESSION: No active cardiopulmonary disease. Electronically Signed   By: Elsie Gravely M.D.   On: 10/18/2023 15:41     Assessment and Plan :   PDMP not reviewed this encounter.  1. Mild persistent asthma with (acute) exacerbation   2. Shortness of breath   3. Wheezing    Will manage for mild acute exacerbation of asthma with a prednisone  course.  Refill his albuterol .  Chest x-ray negative.  Counseled patient on potential for adverse effects with medications prescribed/recommended today, ER and return-to-clinic precautions discussed,  patient verbalized understanding.    Christopher Savannah, [place] 10/19/23 [phone]

## 2023-10-18 NOTE — ED Notes (Signed)
Checked in the lobby.  

## 2023-10-18 NOTE — ED Triage Notes (Signed)
 Pt c/o intermittent SHOB and wheezing x 2 weeks-states he did have an inhaler but as expired-NAD-steady gait

## 2024-03-04 ENCOUNTER — Ambulatory Visit (INDEPENDENT_AMBULATORY_CARE_PROVIDER_SITE_OTHER)

## 2024-03-04 ENCOUNTER — Ambulatory Visit (HOSPITAL_COMMUNITY)
Admission: EM | Admit: 2024-03-04 | Discharge: 2024-03-04 | Disposition: A | Discharge status: Home / Self Care | Attending: Emergency Medicine | Admitting: Emergency Medicine

## 2024-03-04 ENCOUNTER — Encounter (HOSPITAL_COMMUNITY): Payer: Self-pay

## 2024-03-04 DIAGNOSIS — R051 Acute cough: Secondary | ICD-10-CM | POA: Diagnosis not present

## 2024-03-04 DIAGNOSIS — J4531 Mild persistent asthma with (acute) exacerbation: Secondary | ICD-10-CM

## 2024-03-04 MED ORDER — IPRATROPIUM-ALBUTEROL 0.5-2.5 (3) MG/3ML IN SOLN
3.0000 mL | Freq: Once | RESPIRATORY_TRACT | Status: AC
  Administered 2024-03-04: 3 mL via RESPIRATORY_TRACT

## 2024-03-04 MED ORDER — METHYLPREDNISOLONE SODIUM SUCC 125 MG IJ SOLR
INTRAMUSCULAR | Status: AC
  Filled 2024-03-04: qty 2

## 2024-03-04 MED ORDER — PREDNISONE 20 MG PO TABS: 40.0000 mg | ORAL_TABLET | Freq: Every day | ORAL | 0 refills | Status: AC

## 2024-03-04 MED ORDER — METHYLPREDNISOLONE SODIUM SUCC 125 MG IJ SOLR
125.0000 mg | Freq: Once | INTRAMUSCULAR | Status: AC
  Administered 2024-03-04: 125 mg via INTRAMUSCULAR

## 2024-03-04 MED ORDER — IPRATROPIUM-ALBUTEROL 0.5-2.5 (3) MG/3ML IN SOLN
RESPIRATORY_TRACT | Status: AC
  Filled 2024-03-04: qty 3

## 2024-03-04 NOTE — Discharge Instructions (Addendum)
 Your x-ray is negative for any underlying pneumonia. As discussed I believe your symptoms are likely related to an asthma exacerbation. You are given injection of steroids in clinic today to help with this. Tomorrow start taking 2 tablets of prednisone  once daily for 5 days for additional relief of this. Use albuterol  inhaler every 4-6 hours as needed for wheezing and shortness of breath. Follow-up with your primary care provider at the [facility] for further evaluation and management of this. If you develop severe trouble breathing, severe chest pain, severe weakness, persistent fevers, or passing out please seek immediate medical treatment in the emergency department.

## 2024-03-04 NOTE — ED Triage Notes (Signed)
 Patient presents to the office for cough and wheezing that started 10 days ago. Patient states he is using his inhaler.

## 2024-03-04 NOTE — ED Provider Notes (Signed)
 " [facility]    CSN: [id number] Arrival date & time: 03/04/24  9063      History   Chief Complaint Chief Complaint  Patient presents with   Cough    HPI Andre Fitzpatrick is a 69 y.o. male.   Patient presents with cough, wheezing, and shortness of breath that began approximately 10 days ago.  Patient states that her symptoms have progressively worsened over the last few days.  Patient also reports some chest pain with coughing.  Patient reports that he has been using his previously prescribed albuterol  inhaler without relief.  Denies any fever.  Patient does report a history of asthma which he reports was newly diagnosed.  Patient was seen at urgent care in August 2025 and was informed that he may have asthma at that time.  Patient does not have a definitive diagnosis of asthma and is not chronically managed for this.  Patient denies any known history of COPD.  Patient also has history of pneumonia.  Denies any known history of diabetes.  Patient does have a history of hypertension.  The history is provided by the patient and medical records.    Past Medical History:  Diagnosis Date   Asthma     There are no active problems to display for this patient.   Past Surgical History:  Procedure Laterality Date   CYST REMOVAL NECK     URINARY SURGERY         Home Medications    Prior to Admission medications  Medication Sig Start Date End Date Taking? Authorizing Provider  albuterol  (VENTOLIN  HFA) 108 (90 Base) MCG/ACT inhaler Inhale 2 puffs into the lungs every 6 (six) hours as needed for wheezing or shortness of breath (Cough). 10/18/23  Yes Christopher Savannah, PA-C  amLODipine (NORVASC) 10 MG tablet Take 10 mg by mouth daily. for high blood pressure 12/29/23  Yes [provider]  cetirizine (ZYRTEC) 10 MG tablet Take 10 mg by mouth. 04/28/23  Yes [provider]  predniSONE  (DELTASONE ) 20 MG tablet Take 2 tablets (40 mg total) by mouth daily for 5 days.  03/04/24 03/09/24 Yes Johnie Rumaldo LABOR, NP    Family History History reviewed. No pertinent family history.  Social History Social History[1]   Allergies   Patient has no known allergies.   Review of Systems Review of Systems  Per HPI  Physical Exam Triage Vital Signs ED Triage Vitals [03/04/24 1014]  Encounter Vitals Group     BP 138/86     Girls Systolic BP Percentile      Girls Diastolic BP Percentile      Boys Systolic BP Percentile      Boys Diastolic BP Percentile      Pulse Rate 70     Resp 16     Temp 98.1 F (36.7 C)     Temp Source Oral     SpO2 95 %     Weight      Height      Head Circumference      Peak Flow      Pain Score      Pain Loc      Pain Education      Exclude from Growth Chart    No data found.  Updated Vital Signs BP 138/86 (BP Location: Left Arm)   Pulse 70   Temp 98.1 F (36.7 C) (Oral)   Resp 16   SpO2 95%   Visual Acuity Right Eye Distance:  Left Eye Distance:   Bilateral Distance:    Right Eye Near:   Left Eye Near:    Bilateral Near:     Physical Exam Vitals and nursing note reviewed.  Constitutional:      General: He is awake. He is not in acute distress.    Appearance: Normal appearance. He is well-developed and well-groomed. He is not ill-appearing.  HENT:     Nose: Nose normal.     Mouth/Throat:     Mouth: Mucous membranes are moist.     Pharynx: Oropharynx is clear.  Cardiovascular:     Rate and Rhythm: Normal rate and regular rhythm.  Pulmonary:     Effort: Pulmonary effort is normal.     Breath sounds: Decreased air movement present. Wheezing present.  Skin:    General: Skin is warm and dry.  Neurological:     General: No focal deficit present.     Mental Status: He is alert and oriented to person, place, and time. Mental status is at baseline.  Psychiatric:        Behavior: Behavior is cooperative.      UC Treatments / Results  Labs (all labs ordered are listed, but only abnormal results  are displayed) Labs Reviewed - No data to display  EKG   Radiology DG Chest 2 View Result Date: 03/04/2024 CLINICAL DATA:  Cough and wheezing for 10 days. Chest pain and shortness of breath. EXAM: CHEST - 2 VIEW COMPARISON:  10/18/2023 FINDINGS: The heart size and mediastinal contours are within normal limits. Both lungs are clear. The visualized skeletal structures are unremarkable. IMPRESSION: No active cardiopulmonary disease. Electronically Signed   By: Norleen DELENA Kil M.D.   On: 03/04/2024 11:10    Procedures Procedures (including critical care time)  Medications Ordered in UC Medications  ipratropium-albuterol  (DUONEB) 0.5-2.5 (3) MG/3ML nebulizer solution 3 mL (3 mLs Nebulization Given 03/04/24 1050)  methylPREDNISolone  sodium succinate (SOLU-MEDROL ) 125 mg/2 mL injection 125 mg (125 mg Intramuscular Given 03/04/24 1050)    Initial Impression / Assessment and Plan / UC Course  I have reviewed the triage vital signs and the nursing notes.  Pertinent labs & imaging results that were available during my care of the patient were reviewed by me and considered in my medical decision making (see chart for details).     Patient is overall well-appearing.  Vitals are stable.  Patient does not appear to be in any acute respiratory distress.  Wheezing and decreased air movement auscultated throughout.  Given DuoNeb and injection of Solu-Medrol  in clinic with significant relief of this and symptomatic relief.  Order chest x-ray to rule out underlying pneumonia.  I independently interpreted these images and there is no active cardiopulmonary disease.  Radiology report confirms this.  Suspect symptoms are likely related to an asthma exacerbation.  Prescribed short prednisone  burst for this.  Discussed when to use albuterol  inhaler.  Discussed follow-up, return, and strict ER precautions. Final Clinical Impressions(s) / UC Diagnoses   Final diagnoses:  Acute cough  Mild persistent asthma with  acute exacerbation     Discharge Instructions      Your x-ray is negative for any underlying pneumonia. As discussed I believe your symptoms are likely related to an asthma exacerbation. You are given injection of steroids in clinic today to help with this. Tomorrow start taking 2 tablets of prednisone  once daily for 5 days for additional relief of this. Use albuterol  inhaler every 4-6 hours as needed for wheezing and shortness  of breath. Follow-up with your primary care provider at the [facility] for further evaluation and management of this. If you develop severe trouble breathing, severe chest pain, severe weakness, persistent fevers, or passing out please seek immediate medical treatment in the emergency department.     ED Prescriptions     Medication Sig Dispense Auth. Provider   predniSONE  (DELTASONE ) 20 MG tablet Take 2 tablets (40 mg total) by mouth daily for 5 days. 10 tablet Johnie Flaming A, NP      PDMP not reviewed this encounter.    [1]  Social History Tobacco Use   Smoking status: Never   Smokeless tobacco: Never  Vaping Use   Vaping status: Never Used  Substance Use Topics   Alcohol use: No   Drug use: Never     Johnie Flaming LABOR, NP 03/04/24 1123  "
